# Patient Record
Sex: Male | Born: 1970 | Race: White | Hispanic: No | Marital: Married | Smoking: Never smoker
Health system: Southern US, Community
[De-identification: ages and names within clinical notes are randomized; demographics above are authoritative.]

## PROBLEM LIST (undated history)

## (undated) DIAGNOSIS — E669 Obesity, unspecified: Secondary | ICD-10-CM

## (undated) DIAGNOSIS — M25562 Pain in left knee: Secondary | ICD-10-CM

## (undated) DIAGNOSIS — E785 Hyperlipidemia, unspecified: Secondary | ICD-10-CM

## (undated) DIAGNOSIS — K219 Gastro-esophageal reflux disease without esophagitis: Secondary | ICD-10-CM

## (undated) DIAGNOSIS — R0683 Snoring: Secondary | ICD-10-CM

## (undated) DIAGNOSIS — G4726 Circadian rhythm sleep disorder, shift work type: Secondary | ICD-10-CM

## (undated) HISTORY — DX: Pain in left knee: M25.562

## (undated) HISTORY — DX: Snoring: R06.83

## (undated) HISTORY — DX: Obesity, unspecified: E66.9

## (undated) HISTORY — PX: HERNIA REPAIR: SHX51

## (undated) HISTORY — DX: Circadian rhythm sleep disorder, shift work type: G47.26

## (undated) HISTORY — DX: Hyperlipidemia, unspecified: E78.5

## (undated) HISTORY — DX: Gastro-esophageal reflux disease without esophagitis: K21.9

---

## 2003-02-19 ENCOUNTER — Ambulatory Visit (HOSPITAL_COMMUNITY): Admission: RE | Admit: 2003-02-19 | Discharge: 2003-02-19 | Payer: Self-pay | Admitting: Family Medicine

## 2004-11-29 HISTORY — PX: APPENDECTOMY: SHX54

## 2005-06-04 ENCOUNTER — Encounter: Admission: RE | Admit: 2005-06-04 | Discharge: 2005-06-04 | Payer: Self-pay | Admitting: Surgery

## 2005-07-17 ENCOUNTER — Inpatient Hospital Stay (HOSPITAL_COMMUNITY): Admission: RE | Admit: 2005-07-17 | Discharge: 2005-07-21 | Payer: Self-pay | Admitting: Surgery
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
## 2010-06-23 ENCOUNTER — Other Ambulatory Visit: Payer: Self-pay | Admitting: Family Medicine

## 2010-06-23 ENCOUNTER — Ambulatory Visit
Admission: RE | Admit: 2010-06-23 | Discharge: 2010-06-23 | Disposition: A | Discharge status: Home / Self Care | Payer: BC Managed Care – PPO | Source: Ambulatory Visit | Attending: Family Medicine | Admitting: Family Medicine

## 2012-02-04 DIAGNOSIS — M25562 Pain in left knee: Secondary | ICD-10-CM

## 2012-02-04 HISTORY — DX: Pain in left knee: M25.562

## 2012-02-25 ENCOUNTER — Other Ambulatory Visit: Payer: Self-pay

## 2012-02-25 DIAGNOSIS — G4700 Insomnia, unspecified: Secondary | ICD-10-CM

## 2012-03-19 ENCOUNTER — Encounter: Payer: Self-pay | Admitting: Family Medicine

## 2012-03-19 DIAGNOSIS — E669 Obesity, unspecified: Secondary | ICD-10-CM | POA: Insufficient documentation

## 2012-03-19 DIAGNOSIS — K219 Gastro-esophageal reflux disease without esophagitis: Secondary | ICD-10-CM | POA: Insufficient documentation

## 2012-03-19 DIAGNOSIS — E785 Hyperlipidemia, unspecified: Secondary | ICD-10-CM | POA: Insufficient documentation

## 2012-04-11 ENCOUNTER — Ambulatory Visit: Payer: Self-pay | Admitting: Family Medicine

## 2012-09-15 ENCOUNTER — Encounter: Payer: Self-pay | Admitting: Family Medicine

## 2012-09-15 ENCOUNTER — Ambulatory Visit (INDEPENDENT_AMBULATORY_CARE_PROVIDER_SITE_OTHER): Payer: BC Managed Care – PPO | Admitting: Family Medicine

## 2012-09-15 VITALS — BP 136/83 | HR 55 | Temp 97.0°F | Ht 67.5 in | Wt 222.0 lb

## 2012-09-15 DIAGNOSIS — M549 Dorsalgia, unspecified: Secondary | ICD-10-CM

## 2012-09-15 DIAGNOSIS — B029 Zoster without complications: Secondary | ICD-10-CM

## 2012-09-15 MED ORDER — VALACYCLOVIR HCL 1 G PO TABS: 1000.0000 mg | ORAL_TABLET | Freq: Three times a day (TID) | ORAL | Status: DC

## 2012-09-15 MED ORDER — CYCLOBENZAPRINE HCL 10 MG PO TABS: 10.0000 mg | ORAL_TABLET | Freq: Three times a day (TID) | ORAL | Status: DC | PRN

## 2012-09-15 MED ORDER — NAPROXEN 500 MG PO TABS: 500.0000 mg | ORAL_TABLET | Freq: Two times a day (BID) | ORAL | Status: DC
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
## 2012-09-15 NOTE — Patient Instructions (Signed)
Shingles Shingles (herpes zoster) is an infection that is caused by the same virus that causes chickenpox (varicella). The infection causes a painful skin rash and fluid-filled blisters, which eventually break open, crust over, and heal. It may occur in any area of the body, but it usually affects only one side of the body or face. The pain of shingles usually lasts about 1 month. However, some people with shingles may develop long-term (chronic) pain in the affected area of the body. Shingles often occurs many years after the person had chickenpox. It is more common:  In people older than 50 years.  In people with weakened immune systems, such as those with HIV, AIDS, or cancer.  In people taking medicines that weaken the immune system, such as transplant medicines.  In people under great stress. CAUSES  Shingles is caused by the varicella zoster virus (VZV), which also causes chickenpox. After a person is infected with the virus, it can remain in the person's body for years in an inactive state (dormant). To cause shingles, the virus reactivates and breaks out as an infection in a nerve root. The virus can be spread from person to person (contagious) through contact with open blisters of the shingles rash. It will only spread to people who have not had chickenpox. When these people are exposed to the virus, they may develop chickenpox. They will not develop shingles. Once the blisters scab over, the person is no longer contagious and cannot spread the virus to others. SYMPTOMS  Shingles shows up in stages. The initial symptoms may be pain, itching, and tingling in an area of the skin. This pain is usually described as burning, stabbing, or throbbing.In a few days or weeks, a painful red rash will appear in the area where the pain, itching, and tingling were felt. The rash is usually on one side of the body in a band or belt-like pattern. Then, the rash usually turns into fluid-filled blisters. They  will scab over and dry up in approximately 2 3 weeks. Flu-like symptoms may also occur with the initial symptoms, the rash, or the blisters. These may include:  Fever.  Chills.  Headache.  Upset stomach. DIAGNOSIS  Your caregiver will perform a skin exam to diagnose shingles. Skin scrapings or fluid samples may also be taken from the blisters. This sample will be examined under a microscope or sent to a lab for further testing. TREATMENT  There is no specific cure for shingles. Your caregiver will likely prescribe medicines to help you manage the pain, recover faster, and avoid long-term problems. This may include antiviral drugs, anti-inflammatory drugs, and pain medicines. HOME CARE INSTRUCTIONS   Take a cool bath or apply cool compresses to the area of the rash or blisters as directed. This may help with the pain and itching.   Only take over-the-counter or prescription medicines as directed by your caregiver.   Rest as directed by your caregiver.  Keep your rash and blisters clean with mild soap and cool water or as directed by your caregiver.  Do not pick your blisters or scratch your rash. Apply an anti-itch cream or numbing creams to the affected area as directed by your caregiver.  Keep your shingles rash covered with a loose bandage (dressing).  Avoid skin contact with:  Babies.   Pregnant women.   Children with eczema.   Elderly people with transplants.   People with chronic illnesses, such as leukemia or AIDS.   Wear loose-fitting clothing to help ease   the pain of material rubbing against the rash.  Keep all follow-up appointments with your caregiver.If the area involved is on your face, you may receive a referral for follow-up to a specialist, such as an eye doctor (ophthalmologist) or an ear, nose, and throat (ENT) doctor. Keeping all follow-up appointments will help you avoid eye complications, chronic pain, or disability.  SEEK IMMEDIATE MEDICAL  CARE IF:   You have facial pain, pain around the eye area, or loss of feeling on one side of your face.  You have ear pain or ringing in your ear.  You have loss of taste.  Your pain is not relieved with prescribed medicines.   Your redness or swelling spreads.   You have more pain and swelling.  Your condition is worsening or has changed.   You have a feveror persistent symptoms for more than 2 3 days.  You have a fever and your symptoms suddenly get worse. MAKE SURE YOU:  Understand these instructions.  Will watch your condition.  Will get help right away if you are not doing well or get worse. Document Released: 01/05/2005 Document Revised: 09/30/2011 Document Reviewed: 08/20/2011 [employer] Patient Information 2014 [employer].  

## 2012-09-16 ENCOUNTER — Ambulatory Visit: Payer: Self-pay | Admitting: General Practice

## 2015-11-06 DIAGNOSIS — E782 Mixed hyperlipidemia: Secondary | ICD-10-CM | POA: Diagnosis not present

## 2015-11-06 DIAGNOSIS — E669 Obesity, unspecified: Secondary | ICD-10-CM | POA: Diagnosis not present

## 2015-11-06 DIAGNOSIS — Z6836 Body mass index (BMI) 36.0-36.9, adult: Secondary | ICD-10-CM | POA: Diagnosis not present

## 2015-11-06 DIAGNOSIS — K219 Gastro-esophageal reflux disease without esophagitis: Secondary | ICD-10-CM | POA: Diagnosis not present

## 2015-11-25 DIAGNOSIS — E669 Obesity, unspecified: Secondary | ICD-10-CM | POA: Diagnosis not present

## 2015-11-25 DIAGNOSIS — Z6836 Body mass index (BMI) 36.0-36.9, adult: Secondary | ICD-10-CM | POA: Diagnosis not present

## 2015-12-04 DIAGNOSIS — Z6836 Body mass index (BMI) 36.0-36.9, adult: Secondary | ICD-10-CM | POA: Diagnosis not present

## 2015-12-04 DIAGNOSIS — E669 Obesity, unspecified: Secondary | ICD-10-CM | POA: Diagnosis not present

## 2015-12-09 DIAGNOSIS — E669 Obesity, unspecified: Secondary | ICD-10-CM | POA: Diagnosis not present

## 2015-12-09 DIAGNOSIS — Z6836 Body mass index (BMI) 36.0-36.9, adult: Secondary | ICD-10-CM | POA: Diagnosis not present

## 2015-12-18 DIAGNOSIS — Z6836 Body mass index (BMI) 36.0-36.9, adult: Secondary | ICD-10-CM | POA: Diagnosis not present

## 2015-12-18 DIAGNOSIS — E669 Obesity, unspecified: Secondary | ICD-10-CM | POA: Diagnosis not present

## 2016-01-01 DIAGNOSIS — E669 Obesity, unspecified: Secondary | ICD-10-CM | POA: Diagnosis not present

## 2016-01-01 DIAGNOSIS — Z6836 Body mass index (BMI) 36.0-36.9, adult: Secondary | ICD-10-CM | POA: Diagnosis not present

## 2016-02-03 DIAGNOSIS — Z6836 Body mass index (BMI) 36.0-36.9, adult: Secondary | ICD-10-CM | POA: Diagnosis not present

## 2016-02-03 DIAGNOSIS — E669 Obesity, unspecified: Secondary | ICD-10-CM | POA: Diagnosis not present

## 2016-02-26 DIAGNOSIS — Z6836 Body mass index (BMI) 36.0-36.9, adult: Secondary | ICD-10-CM | POA: Diagnosis not present

## 2016-02-26 DIAGNOSIS — E669 Obesity, unspecified: Secondary | ICD-10-CM | POA: Diagnosis not present

## 2016-03-30 DIAGNOSIS — Z6836 Body mass index (BMI) 36.0-36.9, adult: Secondary | ICD-10-CM | POA: Diagnosis not present

## 2016-03-30 DIAGNOSIS — E782 Mixed hyperlipidemia: Secondary | ICD-10-CM | POA: Diagnosis not present

## 2016-03-30 DIAGNOSIS — Z008 Encounter for other general examination: Secondary | ICD-10-CM | POA: Diagnosis not present

## 2016-03-30 DIAGNOSIS — K219 Gastro-esophageal reflux disease without esophagitis: Secondary | ICD-10-CM | POA: Diagnosis not present

## 2016-03-30 DIAGNOSIS — Z1389 Encounter for screening for other disorder: Secondary | ICD-10-CM | POA: Diagnosis not present

## 2016-03-30 DIAGNOSIS — E669 Obesity, unspecified: Secondary | ICD-10-CM | POA: Diagnosis not present

## 2016-04-08 ENCOUNTER — Encounter: Payer: Self-pay | Admitting: Family Medicine

## 2016-06-08 DIAGNOSIS — K219 Gastro-esophageal reflux disease without esophagitis: Secondary | ICD-10-CM | POA: Diagnosis not present

## 2016-06-08 DIAGNOSIS — Z6836 Body mass index (BMI) 36.0-36.9, adult: Secondary | ICD-10-CM | POA: Diagnosis not present

## 2016-06-08 DIAGNOSIS — Z719 Counseling, unspecified: Secondary | ICD-10-CM | POA: Diagnosis not present

## 2016-06-08 DIAGNOSIS — E782 Mixed hyperlipidemia: Secondary | ICD-10-CM | POA: Diagnosis not present

## 2016-06-08 DIAGNOSIS — Z008 Encounter for other general examination: Secondary | ICD-10-CM | POA: Diagnosis not present

## 2016-06-08 DIAGNOSIS — E669 Obesity, unspecified: Secondary | ICD-10-CM | POA: Diagnosis not present

## 2016-08-17 DIAGNOSIS — E669 Obesity, unspecified: Secondary | ICD-10-CM | POA: Diagnosis not present

## 2016-08-17 DIAGNOSIS — Z008 Encounter for other general examination: Secondary | ICD-10-CM | POA: Diagnosis not present

## 2016-08-17 DIAGNOSIS — Z6836 Body mass index (BMI) 36.0-36.9, adult: Secondary | ICD-10-CM | POA: Diagnosis not present

## 2016-08-17 DIAGNOSIS — Z719 Counseling, unspecified: Secondary | ICD-10-CM | POA: Diagnosis not present

## 2016-08-17 DIAGNOSIS — K219 Gastro-esophageal reflux disease without esophagitis: Secondary | ICD-10-CM | POA: Diagnosis not present

## 2016-08-17 DIAGNOSIS — E782 Mixed hyperlipidemia: Secondary | ICD-10-CM | POA: Diagnosis not present

## 2016-11-09 DIAGNOSIS — E782 Mixed hyperlipidemia: Secondary | ICD-10-CM | POA: Diagnosis not present

## 2016-11-09 DIAGNOSIS — Z139 Encounter for screening, unspecified: Secondary | ICD-10-CM | POA: Diagnosis not present

## 2016-11-09 DIAGNOSIS — Z008 Encounter for other general examination: Secondary | ICD-10-CM | POA: Diagnosis not present

## 2016-11-18 DIAGNOSIS — E782 Mixed hyperlipidemia: Secondary | ICD-10-CM | POA: Diagnosis not present

## 2016-11-18 DIAGNOSIS — E669 Obesity, unspecified: Secondary | ICD-10-CM | POA: Diagnosis not present

## 2016-11-18 DIAGNOSIS — Z008 Encounter for other general examination: Secondary | ICD-10-CM | POA: Diagnosis not present

## 2016-11-18 DIAGNOSIS — Z719 Counseling, unspecified: Secondary | ICD-10-CM | POA: Diagnosis not present

## 2016-11-18 DIAGNOSIS — Z7189 Other specified counseling: Secondary | ICD-10-CM | POA: Diagnosis not present

## 2016-11-18 DIAGNOSIS — K219 Gastro-esophageal reflux disease without esophagitis: Secondary | ICD-10-CM | POA: Diagnosis not present

## 2016-11-18 DIAGNOSIS — Z6837 Body mass index (BMI) 37.0-37.9, adult: Secondary | ICD-10-CM | POA: Diagnosis not present

## 2016-12-21 DIAGNOSIS — Z716 Tobacco abuse counseling: Secondary | ICD-10-CM | POA: Diagnosis not present

## 2016-12-21 DIAGNOSIS — R030 Elevated blood-pressure reading, without diagnosis of hypertension: Secondary | ICD-10-CM | POA: Diagnosis not present

## 2016-12-21 DIAGNOSIS — Z7722 Contact with and (suspected) exposure to environmental tobacco smoke (acute) (chronic): Secondary | ICD-10-CM | POA: Diagnosis not present

## 2017-02-01 DIAGNOSIS — I10 Essential (primary) hypertension: Secondary | ICD-10-CM | POA: Diagnosis not present

## 2017-02-01 DIAGNOSIS — Z7722 Contact with and (suspected) exposure to environmental tobacco smoke (acute) (chronic): Secondary | ICD-10-CM | POA: Diagnosis not present

## 2017-02-01 DIAGNOSIS — Z008 Encounter for other general examination: Secondary | ICD-10-CM | POA: Diagnosis not present

## 2017-02-01 DIAGNOSIS — E669 Obesity, unspecified: Secondary | ICD-10-CM | POA: Diagnosis not present

## 2017-02-01 DIAGNOSIS — Z6837 Body mass index (BMI) 37.0-37.9, adult: Secondary | ICD-10-CM | POA: Diagnosis not present

## 2017-02-01 DIAGNOSIS — E782 Mixed hyperlipidemia: Secondary | ICD-10-CM | POA: Diagnosis not present

## 2017-02-01 DIAGNOSIS — Z1389 Encounter for screening for other disorder: Secondary | ICD-10-CM | POA: Diagnosis not present

## 2017-02-01 DIAGNOSIS — Z719 Counseling, unspecified: Secondary | ICD-10-CM | POA: Diagnosis not present

## 2017-02-15 DIAGNOSIS — Z79899 Other long term (current) drug therapy: Secondary | ICD-10-CM | POA: Diagnosis not present

## 2017-02-15 DIAGNOSIS — Z7722 Contact with and (suspected) exposure to environmental tobacco smoke (acute) (chronic): Secondary | ICD-10-CM | POA: Diagnosis not present

## 2017-02-15 DIAGNOSIS — I10 Essential (primary) hypertension: Secondary | ICD-10-CM | POA: Diagnosis not present

## 2017-02-15 DIAGNOSIS — Z716 Tobacco abuse counseling: Secondary | ICD-10-CM | POA: Diagnosis not present

## 2017-03-10 DIAGNOSIS — Z7722 Contact with and (suspected) exposure to environmental tobacco smoke (acute) (chronic): Secondary | ICD-10-CM | POA: Diagnosis not present

## 2017-03-10 DIAGNOSIS — I10 Essential (primary) hypertension: Secondary | ICD-10-CM | POA: Diagnosis not present

## 2017-03-10 DIAGNOSIS — Z716 Tobacco abuse counseling: Secondary | ICD-10-CM | POA: Diagnosis not present

## 2017-05-05 DIAGNOSIS — K219 Gastro-esophageal reflux disease without esophagitis: Secondary | ICD-10-CM | POA: Diagnosis not present

## 2017-05-05 DIAGNOSIS — E669 Obesity, unspecified: Secondary | ICD-10-CM | POA: Diagnosis not present

## 2017-05-05 DIAGNOSIS — Z008 Encounter for other general examination: Secondary | ICD-10-CM | POA: Diagnosis not present

## 2017-05-05 DIAGNOSIS — Z7722 Contact with and (suspected) exposure to environmental tobacco smoke (acute) (chronic): Secondary | ICD-10-CM | POA: Diagnosis not present

## 2017-05-05 DIAGNOSIS — E782 Mixed hyperlipidemia: Secondary | ICD-10-CM | POA: Diagnosis not present

## 2017-05-05 DIAGNOSIS — Z719 Counseling, unspecified: Secondary | ICD-10-CM | POA: Diagnosis not present

## 2017-05-26 DIAGNOSIS — I10 Essential (primary) hypertension: Secondary | ICD-10-CM | POA: Diagnosis not present

## 2017-08-04 DIAGNOSIS — Z719 Counseling, unspecified: Secondary | ICD-10-CM | POA: Diagnosis not present

## 2017-08-04 DIAGNOSIS — E782 Mixed hyperlipidemia: Secondary | ICD-10-CM | POA: Diagnosis not present

## 2017-08-04 DIAGNOSIS — Z7722 Contact with and (suspected) exposure to environmental tobacco smoke (acute) (chronic): Secondary | ICD-10-CM | POA: Diagnosis not present

## 2017-08-04 DIAGNOSIS — Z008 Encounter for other general examination: Secondary | ICD-10-CM | POA: Diagnosis not present

## 2017-09-09 DIAGNOSIS — Z79899 Other long term (current) drug therapy: Secondary | ICD-10-CM | POA: Diagnosis not present

## 2017-09-09 DIAGNOSIS — M545 Low back pain: Secondary | ICD-10-CM | POA: Diagnosis not present

## 2017-09-09 DIAGNOSIS — Z87891 Personal history of nicotine dependence: Secondary | ICD-10-CM | POA: Diagnosis not present

## 2017-09-09 DIAGNOSIS — R079 Chest pain, unspecified: Secondary | ICD-10-CM | POA: Diagnosis not present

## 2017-09-09 DIAGNOSIS — S3991XA Unspecified injury of abdomen, initial encounter: Secondary | ICD-10-CM | POA: Diagnosis not present

## 2017-09-09 DIAGNOSIS — R1030 Lower abdominal pain, unspecified: Secondary | ICD-10-CM | POA: Diagnosis not present

## 2017-09-09 DIAGNOSIS — S299XXA Unspecified injury of thorax, initial encounter: Secondary | ICD-10-CM | POA: Diagnosis not present

## 2017-09-09 DIAGNOSIS — I10 Essential (primary) hypertension: Secondary | ICD-10-CM | POA: Diagnosis not present

## 2017-09-09 DIAGNOSIS — M25562 Pain in left knee: Secondary | ICD-10-CM | POA: Diagnosis not present

## 2017-09-09 DIAGNOSIS — R109 Unspecified abdominal pain: Secondary | ICD-10-CM | POA: Diagnosis not present

## 2017-09-09 DIAGNOSIS — S8992XA Unspecified injury of left lower leg, initial encounter: Secondary | ICD-10-CM | POA: Diagnosis not present

## 2017-09-09 DIAGNOSIS — M25561 Pain in right knee: Secondary | ICD-10-CM | POA: Diagnosis not present

## 2017-09-09 DIAGNOSIS — S8991XA Unspecified injury of right lower leg, initial encounter: Secondary | ICD-10-CM | POA: Diagnosis not present

## 2017-09-09 DIAGNOSIS — M170 Bilateral primary osteoarthritis of knee: Secondary | ICD-10-CM | POA: Diagnosis not present

## 2017-09-09 DIAGNOSIS — R1084 Generalized abdominal pain: Secondary | ICD-10-CM | POA: Diagnosis not present

## 2017-09-09 DIAGNOSIS — K219 Gastro-esophageal reflux disease without esophagitis: Secondary | ICD-10-CM | POA: Diagnosis not present

## 2017-09-14 ENCOUNTER — Ambulatory Visit (INDEPENDENT_AMBULATORY_CARE_PROVIDER_SITE_OTHER): Payer: BLUE CROSS/BLUE SHIELD

## 2017-09-14 ENCOUNTER — Encounter: Payer: Self-pay | Admitting: Family Medicine

## 2017-09-14 ENCOUNTER — Ambulatory Visit (INDEPENDENT_AMBULATORY_CARE_PROVIDER_SITE_OTHER): Payer: BLUE CROSS/BLUE SHIELD | Admitting: Family Medicine

## 2017-09-14 VITALS — BP 166/96 | HR 73 | Temp 97.3°F | Ht 67.5 in | Wt 220.5 lb

## 2017-09-14 DIAGNOSIS — M545 Low back pain, unspecified: Secondary | ICD-10-CM

## 2017-09-14 DIAGNOSIS — R1032 Left lower quadrant pain: Secondary | ICD-10-CM | POA: Diagnosis not present

## 2017-09-14 DIAGNOSIS — S3991XA Unspecified injury of abdomen, initial encounter: Secondary | ICD-10-CM | POA: Diagnosis not present

## 2017-09-14 DIAGNOSIS — R109 Unspecified abdominal pain: Secondary | ICD-10-CM | POA: Diagnosis not present

## 2017-09-14 DIAGNOSIS — S3992XA Unspecified injury of lower back, initial encounter: Secondary | ICD-10-CM | POA: Diagnosis not present

## 2017-09-14 LAB — URINALYSIS
Bilirubin, UA: NEGATIVE
Glucose, UA: NEGATIVE
Ketones, UA: NEGATIVE
Leukocytes, UA: NEGATIVE
Nitrite, UA: NEGATIVE
Protein, UA: NEGATIVE
RBC, UA: NEGATIVE
Specific Gravity, UA: >1.03 — ABNORMAL HIGH (ref 1.005–1.030)
Urobilinogen, Ur: 0.2 mg/dL (ref 0.2–1.0)
pH, UA: 5.5 (ref 5.0–7.5)

## 2017-09-14 MED ORDER — CYCLOBENZAPRINE HCL 10 MG PO TABS: 10.0000 mg | ORAL_TABLET | Freq: Three times a day (TID) | ORAL | 0 refills | Status: AC | PRN

## 2017-09-14 MED ORDER — DICLOFENAC SODIUM 75 MG PO TBEC: 75.0000 mg | DELAYED_RELEASE_TABLET | Freq: Two times a day (BID) | ORAL | 0 refills | Status: AC

## 2017-09-14 NOTE — Patient Instructions (Signed)
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
## 2017-09-20 ENCOUNTER — Encounter: Payer: Self-pay | Admitting: Family Medicine

## 2017-10-06 ENCOUNTER — Telehealth: Payer: Self-pay | Admitting: Family Medicine

## 2017-12-08 DIAGNOSIS — Z008 Encounter for other general examination: Secondary | ICD-10-CM | POA: Diagnosis not present

## 2017-12-08 DIAGNOSIS — E669 Obesity, unspecified: Secondary | ICD-10-CM | POA: Diagnosis not present

## 2017-12-08 DIAGNOSIS — Z7722 Contact with and (suspected) exposure to environmental tobacco smoke (acute) (chronic): Secondary | ICD-10-CM | POA: Diagnosis not present

## 2017-12-08 DIAGNOSIS — Z719 Counseling, unspecified: Secondary | ICD-10-CM | POA: Diagnosis not present

## 2017-12-15 ENCOUNTER — Telehealth: Payer: Self-pay | Admitting: *Deleted

## 2017-12-27 DIAGNOSIS — J0100 Acute maxillary sinusitis, unspecified: Secondary | ICD-10-CM | POA: Diagnosis not present

## 2018-01-05 DIAGNOSIS — Z139 Encounter for screening, unspecified: Secondary | ICD-10-CM | POA: Diagnosis not present

## 2018-01-05 DIAGNOSIS — Z0130 Encounter for examination of blood pressure without abnormal findings: Secondary | ICD-10-CM | POA: Diagnosis not present

## 2018-01-05 DIAGNOSIS — E782 Mixed hyperlipidemia: Secondary | ICD-10-CM | POA: Diagnosis not present

## 2018-01-05 DIAGNOSIS — Z79899 Other long term (current) drug therapy: Secondary | ICD-10-CM | POA: Diagnosis not present

## 2018-01-20 NOTE — Telephone Encounter (Signed)
lmtcb for flu shot 

## 2018-01-24 DIAGNOSIS — E669 Obesity, unspecified: Secondary | ICD-10-CM | POA: Diagnosis not present

## 2018-01-24 DIAGNOSIS — Z716 Tobacco abuse counseling: Secondary | ICD-10-CM | POA: Diagnosis not present

## 2018-01-24 DIAGNOSIS — I10 Essential (primary) hypertension: Secondary | ICD-10-CM | POA: Diagnosis not present

## 2018-01-24 DIAGNOSIS — J0101 Acute recurrent maxillary sinusitis: Secondary | ICD-10-CM | POA: Diagnosis not present

## 2018-01-24 DIAGNOSIS — E782 Mixed hyperlipidemia: Secondary | ICD-10-CM | POA: Diagnosis not present

## 2018-01-24 DIAGNOSIS — K219 Gastro-esophageal reflux disease without esophagitis: Secondary | ICD-10-CM | POA: Diagnosis not present

## 2018-01-24 DIAGNOSIS — Z719 Counseling, unspecified: Secondary | ICD-10-CM | POA: Diagnosis not present

## 2018-01-24 DIAGNOSIS — Z7722 Contact with and (suspected) exposure to environmental tobacco smoke (acute) (chronic): Secondary | ICD-10-CM | POA: Diagnosis not present

## 2018-02-21 DIAGNOSIS — Z7722 Contact with and (suspected) exposure to environmental tobacco smoke (acute) (chronic): Secondary | ICD-10-CM | POA: Diagnosis not present

## 2018-02-21 DIAGNOSIS — Z716 Tobacco abuse counseling: Secondary | ICD-10-CM | POA: Diagnosis not present

## 2018-03-16 DIAGNOSIS — Z716 Tobacco abuse counseling: Secondary | ICD-10-CM | POA: Diagnosis not present

## 2018-03-16 DIAGNOSIS — Z7722 Contact with and (suspected) exposure to environmental tobacco smoke (acute) (chronic): Secondary | ICD-10-CM | POA: Diagnosis not present

## 2018-03-30 DIAGNOSIS — Z716 Tobacco abuse counseling: Secondary | ICD-10-CM | POA: Diagnosis not present

## 2018-03-30 DIAGNOSIS — Z7722 Contact with and (suspected) exposure to environmental tobacco smoke (acute) (chronic): Secondary | ICD-10-CM | POA: Diagnosis not present

## 2018-03-30 DIAGNOSIS — K219 Gastro-esophageal reflux disease without esophagitis: Secondary | ICD-10-CM | POA: Diagnosis not present

## 2018-08-03 DIAGNOSIS — I10 Essential (primary) hypertension: Secondary | ICD-10-CM | POA: Diagnosis not present

## 2018-08-03 DIAGNOSIS — Z79899 Other long term (current) drug therapy: Secondary | ICD-10-CM | POA: Diagnosis not present

## 2018-08-03 DIAGNOSIS — Z0189 Encounter for other specified special examinations: Secondary | ICD-10-CM | POA: Diagnosis not present

## 2018-08-03 DIAGNOSIS — E782 Mixed hyperlipidemia: Secondary | ICD-10-CM | POA: Diagnosis not present

## 2018-08-17 DIAGNOSIS — E669 Obesity, unspecified: Secondary | ICD-10-CM | POA: Diagnosis not present

## 2018-08-17 DIAGNOSIS — Z008 Encounter for other general examination: Secondary | ICD-10-CM | POA: Diagnosis not present

## 2018-08-17 DIAGNOSIS — Z719 Counseling, unspecified: Secondary | ICD-10-CM | POA: Diagnosis not present

## 2018-08-17 DIAGNOSIS — Z7722 Contact with and (suspected) exposure to environmental tobacco smoke (acute) (chronic): Secondary | ICD-10-CM | POA: Diagnosis not present

## 2018-08-31 DIAGNOSIS — I10 Essential (primary) hypertension: Secondary | ICD-10-CM | POA: Diagnosis not present

## 2018-10-17 DIAGNOSIS — Z79899 Other long term (current) drug therapy: Secondary | ICD-10-CM | POA: Diagnosis not present

## 2018-10-17 DIAGNOSIS — I10 Essential (primary) hypertension: Secondary | ICD-10-CM | POA: Diagnosis not present

## 2018-10-26 DIAGNOSIS — I10 Essential (primary) hypertension: Secondary | ICD-10-CM | POA: Diagnosis not present

## 2018-11-09 DIAGNOSIS — I10 Essential (primary) hypertension: Secondary | ICD-10-CM | POA: Diagnosis not present

## 2018-11-28 DIAGNOSIS — I10 Essential (primary) hypertension: Secondary | ICD-10-CM | POA: Diagnosis not present

## 2018-12-21 DIAGNOSIS — I10 Essential (primary) hypertension: Secondary | ICD-10-CM | POA: Diagnosis not present
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
## 2021-08-11 ENCOUNTER — Other Ambulatory Visit (HOSPITAL_BASED_OUTPATIENT_CLINIC_OR_DEPARTMENT_OTHER): Payer: Self-pay

## 2021-08-11 MED ORDER — WEGOVY 1 MG/0.5ML ~~LOC~~ SOAJ
SUBCUTANEOUS | 0 refills | Status: AC
  Filled 2021-08-11: qty 2, 28d supply, fill #0

## 2021-10-13 ENCOUNTER — Other Ambulatory Visit (HOSPITAL_BASED_OUTPATIENT_CLINIC_OR_DEPARTMENT_OTHER): Payer: Self-pay

## 2021-10-13 MED ORDER — WEGOVY 2.4 MG/0.75ML ~~LOC~~ SOAJ
SUBCUTANEOUS | 0 refills | Status: DC
  Filled 2021-10-13 – 2021-10-14 (×2): qty 3, 28d supply, fill #0

## 2021-10-14 ENCOUNTER — Other Ambulatory Visit (HOSPITAL_BASED_OUTPATIENT_CLINIC_OR_DEPARTMENT_OTHER): Payer: Self-pay

## 2021-11-03 ENCOUNTER — Other Ambulatory Visit (HOSPITAL_BASED_OUTPATIENT_CLINIC_OR_DEPARTMENT_OTHER): Payer: Self-pay

## 2021-11-03 MED ORDER — WEGOVY 2.4 MG/0.75ML ~~LOC~~ SOAJ
SUBCUTANEOUS | 0 refills | Status: DC
  Filled 2021-11-03 – 2021-11-07 (×2): qty 3, 28d supply, fill #0

## 2021-11-07 ENCOUNTER — Other Ambulatory Visit (HOSPITAL_BASED_OUTPATIENT_CLINIC_OR_DEPARTMENT_OTHER): Payer: Self-pay

## 2022-02-04 ENCOUNTER — Other Ambulatory Visit (HOSPITAL_BASED_OUTPATIENT_CLINIC_OR_DEPARTMENT_OTHER): Payer: Self-pay

## 2022-02-04 MED ORDER — WEGOVY 2.4 MG/0.75ML ~~LOC~~ SOAJ
2.4000 mg | SUBCUTANEOUS | 0 refills | Status: AC
  Filled 2022-02-04: qty 3, 28d supply, fill #0

## 2022-02-16 ENCOUNTER — Other Ambulatory Visit (HOSPITAL_BASED_OUTPATIENT_CLINIC_OR_DEPARTMENT_OTHER): Payer: Self-pay

## 2023-11-01 ENCOUNTER — Other Ambulatory Visit (HOSPITAL_BASED_OUTPATIENT_CLINIC_OR_DEPARTMENT_OTHER): Payer: Self-pay
